# Patient Record
Sex: Female | Born: 1990 | Race: Black or African American | Hispanic: No | Marital: Single | Smoking: Never smoker
Health system: Southern US, Community
[De-identification: ages and names within clinical notes are randomized; demographics above are authoritative.]

## PROBLEM LIST (undated history)

## (undated) DIAGNOSIS — M1990 Unspecified osteoarthritis, unspecified site: Secondary | ICD-10-CM

## (undated) DIAGNOSIS — J45909 Unspecified asthma, uncomplicated: Secondary | ICD-10-CM

---
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
## 2010-05-09 ENCOUNTER — Emergency Department: Payer: Self-pay | Admitting: Emergency Medicine

## 2010-05-31 ENCOUNTER — Emergency Department: Payer: Self-pay

## 2010-07-30 ENCOUNTER — Ambulatory Visit: Payer: Self-pay | Admitting: Family Medicine

## 2010-10-04 ENCOUNTER — Observation Stay: Payer: Self-pay | Admitting: Obstetrics and Gynecology

## 2010-11-23 ENCOUNTER — Ambulatory Visit: Payer: Self-pay | Admitting: Obstetrics and Gynecology

## 2010-11-25 ENCOUNTER — Observation Stay: Payer: Self-pay

## 2010-11-26 ENCOUNTER — Inpatient Hospital Stay: Payer: Self-pay | Admitting: Obstetrics and Gynecology

## 2011-04-01 ENCOUNTER — Emergency Department: Payer: Self-pay | Admitting: Unknown Physician Specialty

## 2011-10-02 ENCOUNTER — Emergency Department: Payer: Self-pay | Admitting: Emergency Medicine

## 2011-11-10 ENCOUNTER — Emergency Department: Payer: Self-pay | Admitting: Emergency Medicine

## 2012-04-26 ENCOUNTER — Emergency Department: Payer: Self-pay | Admitting: Emergency Medicine

## 2013-04-16 ENCOUNTER — Emergency Department: Payer: Self-pay | Admitting: Emergency Medicine

## 2014-02-24 ENCOUNTER — Ambulatory Visit: Payer: Self-pay | Admitting: Emergency Medicine

## 2014-02-24 LAB — PREGNANCY, URINE: Pregnancy Test, Urine: NEGATIVE m[IU]/mL

## 2015-06-27 ENCOUNTER — Emergency Department
Admission: EM | Admit: 2015-06-27 | Discharge: 2015-06-27 | Disposition: A | Discharge status: Home / Self Care | Payer: BC Managed Care – PPO | Attending: Emergency Medicine | Admitting: Emergency Medicine

## 2015-06-27 ENCOUNTER — Emergency Department: Payer: BC Managed Care – PPO

## 2015-06-27 ENCOUNTER — Encounter: Payer: Self-pay | Admitting: Emergency Medicine

## 2015-06-27 DIAGNOSIS — S29012A Strain of muscle and tendon of back wall of thorax, initial encounter: Secondary | ICD-10-CM | POA: Insufficient documentation

## 2015-06-27 DIAGNOSIS — Y9389 Activity, other specified: Secondary | ICD-10-CM | POA: Diagnosis not present

## 2015-06-27 DIAGNOSIS — Y998 Other external cause status: Secondary | ICD-10-CM | POA: Diagnosis not present

## 2015-06-27 DIAGNOSIS — S79922A Unspecified injury of left thigh, initial encounter: Secondary | ICD-10-CM | POA: Diagnosis not present

## 2015-06-27 DIAGNOSIS — Y92410 Unspecified street and highway as the place of occurrence of the external cause: Secondary | ICD-10-CM | POA: Insufficient documentation

## 2015-06-27 DIAGNOSIS — S29019A Strain of muscle and tendon of unspecified wall of thorax, initial encounter: Secondary | ICD-10-CM

## 2015-06-27 DIAGNOSIS — S299XXA Unspecified injury of thorax, initial encounter: Secondary | ICD-10-CM | POA: Diagnosis present

## 2015-06-27 HISTORY — DX: Unspecified osteoarthritis, unspecified site: M19.90

## 2015-06-27 MED ORDER — CYCLOBENZAPRINE HCL 10 MG PO TABS: 10.0000 mg | ORAL_TABLET | Freq: Three times a day (TID) | ORAL | Status: DC | PRN

## 2015-06-27 MED ORDER — IBUPROFEN 800 MG PO TABS: 800.0000 mg | ORAL_TABLET | Freq: Three times a day (TID) | ORAL | Status: DC | PRN
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
## 2016-07-22 ENCOUNTER — Emergency Department
Admission: EM | Admit: 2016-07-22 | Discharge: 2016-07-22 | Discharge status: Against Medical Advice | Payer: BC Managed Care – PPO

## 2019-08-23 ENCOUNTER — Ambulatory Visit: Payer: Medicaid Other | Attending: Internal Medicine

## 2019-08-23 DIAGNOSIS — Z20822 Contact with and (suspected) exposure to covid-19: Secondary | ICD-10-CM

## 2019-08-24 ENCOUNTER — Other Ambulatory Visit: Payer: Medicaid Other

## 2019-08-24 LAB — NOVEL CORONAVIRUS, NAA: SARS-CoV-2, NAA: NOT DETECTED

## 2019-08-26 ENCOUNTER — Other Ambulatory Visit: Payer: Medicaid Other

## 2019-08-31 ENCOUNTER — Ambulatory Visit: Payer: Medicaid Other | Attending: Internal Medicine

## 2019-08-31 DIAGNOSIS — Z20822 Contact with and (suspected) exposure to covid-19: Secondary | ICD-10-CM

## 2019-09-01 LAB — NOVEL CORONAVIRUS, NAA: SARS-CoV-2, NAA: NOT DETECTED

## 2019-09-07 ENCOUNTER — Emergency Department
Admission: EM | Admit: 2019-09-07 | Discharge: 2019-09-07 | Disposition: A | Discharge status: Home / Self Care | Payer: Medicaid Other | Attending: Emergency Medicine | Admitting: Emergency Medicine

## 2019-09-07 ENCOUNTER — Emergency Department: Payer: Medicaid Other

## 2019-09-07 ENCOUNTER — Other Ambulatory Visit: Payer: Self-pay

## 2019-09-07 DIAGNOSIS — J45901 Unspecified asthma with (acute) exacerbation: Secondary | ICD-10-CM | POA: Diagnosis not present

## 2019-09-07 DIAGNOSIS — Z20822 Contact with and (suspected) exposure to covid-19: Secondary | ICD-10-CM | POA: Diagnosis not present

## 2019-09-07 DIAGNOSIS — J40 Bronchitis, not specified as acute or chronic: Secondary | ICD-10-CM

## 2019-09-07 DIAGNOSIS — R0600 Dyspnea, unspecified: Secondary | ICD-10-CM

## 2019-09-07 DIAGNOSIS — R0602 Shortness of breath: Secondary | ICD-10-CM | POA: Diagnosis present

## 2019-09-07 HISTORY — DX: Unspecified asthma, uncomplicated: J45.909

## 2019-09-07 LAB — RESPIRATORY PANEL BY RT PCR (FLU A&B, COVID)
Influenza A by PCR: NEGATIVE
Influenza B by PCR: NEGATIVE
SARS Coronavirus 2 by RT PCR: NEGATIVE

## 2019-09-07 MED ORDER — AZITHROMYCIN 500 MG PO TABS
500.0000 mg | ORAL_TABLET | Freq: Once | ORAL | Status: AC
  Administered 2019-09-07: 500 mg via ORAL
  Filled 2019-09-07: qty 1

## 2019-09-07 MED ORDER — PREDNISONE 10 MG (21) PO TBPK: ORAL_TABLET | ORAL | 0 refills | Status: DC

## 2019-09-07 MED ORDER — HYDROCOD POLST-CPM POLST ER 10-8 MG/5ML PO SUER
5.0000 mL | Freq: Once | ORAL | Status: AC
  Administered 2019-09-07: 5 mL via ORAL
  Filled 2019-09-07: qty 5

## 2019-09-07 MED ORDER — AZITHROMYCIN 250 MG PO TABS: ORAL_TABLET | ORAL | 0 refills | Status: DC

## 2019-09-07 MED ORDER — PREDNISONE 20 MG PO TABS
60.0000 mg | ORAL_TABLET | Freq: Once | ORAL | Status: AC
  Administered 2019-09-07: 60 mg via ORAL
  Filled 2019-09-07: qty 3

## 2019-09-07 MED ORDER — HYDROCOD POLST-CPM POLST ER 10-8 MG/5ML PO SUER: 5.0000 mL | Freq: Two times a day (BID) | ORAL | 0 refills | Status: DC

## 2019-09-07 NOTE — ED Triage Notes (Signed)
Pt to the er for sob since last week. Pt states her son had covid on Feb 1st. Pt tested on Feb1st and 9th, negative both times. Pt was symptomatic then. Pt states the only symptoms that remain are loss of taste, the SOB, and loss smell.

## 2019-09-07 NOTE — ED Notes (Signed)
Attempted to get Covid Swab from patient. Explained procedure to pt. Upon swab entering nostril pt grabbed nurse's wrist and refused to finish the swab. Advised pt that results may not be accurate due to poor test sample. Swab had a small amount of sample on it and has been sent to lab.

## 2019-09-07 NOTE — ED Provider Notes (Signed)
[facility] Emergency Department Provider Note       Time seen: ----------------------------------------- 8:08 PM on 09/07/2019 -----------------------------------------   I have reviewed the triage vital signs and the nursing notes.  HISTORY   Chief Complaint Shortness of Breath    HPI Kylie Jackson is a 29 y.o. female with a history of asthma who presents to the ED for shortness of breath since last week.  Patient states her son had Covid on February 1.  She was tested on the first in the night and was negative both times.  She was symptomatic when she was tested.  She states the only symptoms that remain her loss of taste, the shortness of breath and loss of smell.  Discomfort is 5 out of 10.  Past Medical History:  Diagnosis Date  . Asthma     There are no problems to display for this patient.   Past Surgical History:  Procedure Laterality Date  . CESAREAN SECTION      Allergies Patient has no known allergies.  Social History Social History   Tobacco Use  . Smoking status: Never Smoker  Substance Use Topics  . Alcohol use: Yes    Comment: social  . Drug use: No    Review of Systems Constitutional: Negative for fever. HEENT: Positive for loss of taste and smell Cardiovascular: Negative for chest pain. Respiratory: Positive for shortness of breath Skin: Negative for rash. Neurological: Negative for headaches, focal weakness or numbness.  All systems negative/normal/unremarkable except as stated in the HPI  ____________________________________________   PHYSICAL EXAM:  VITAL SIGNS: ED Triage Vitals  Enc Vitals Group     BP 09/07/19 1923 (!) 143/82     Pulse Rate 09/07/19 1923 98     Resp 09/07/19 1923 (!) 24     Temp 09/07/19 1923 98.9 F (37.2 C)     Temp Source 09/07/19 1923 Oral     SpO2 09/07/19 1923 100 %     Weight 09/07/19 1926 250 lb (113.4 kg)     Height 09/07/19 1926 5\' 3"  (1.6 m)     Head Circumference --       Peak Flow --      Pain Score 09/07/19 1926 5     Pain Loc --      Pain Edu? --      Excl. in [place]? --    Constitutional: Alert and oriented. Well appearing and in no distress. Eyes: Conjunctivae are normal. Normal extraocular movements. Cardiovascular: Normal rate, regular rhythm. No murmurs, rubs, or gallops. Respiratory: Normal respiratory effort without tachypnea nor retractions. Breath sounds are clear and equal bilaterally. No wheezes/rales/rhonchi. Gastrointestinal: Soft and nontender. Normal bowel sounds Musculoskeletal: Nontender with normal range of motion in extremities. No lower extremity tenderness nor edema. Neurologic:  Normal speech and language. No gross focal neurologic deficits are appreciated.  Skin:  Skin is warm, dry and intact. No rash noted. Psychiatric: Mood and affect are normal. Speech and behavior are normal.  ____________________________________________  ED COURSE:  As part of my medical decision making, I reviewed the following data within the [place] History obtained from family if available, nursing notes, old chart and ekg, as well as notes from prior ED visits. Patient presented for dyspnea, we will assess with imaging as indicated at this time.   Procedures  Kylie Jackson was evaluated in Emergency Department on 09/07/2019 for the symptoms described in the history of present illness. She was evaluated in  the context of the global COVID-19 pandemic, which necessitated consideration that the patient might be at risk for infection with the SARS-CoV-2 virus that causes COVID-19. Institutional protocols and algorithms that pertain to the evaluation of patients at risk for COVID-19 are in a state of rapid change based on information released by regulatory bodies including the CDC and federal and state organizations. These policies and algorithms were followed during the patient's care in the ED.   ____________________________________________   RADIOLOGY Images were viewed by me  Chest x-ray IMPRESSION:  No active disease.  ____________________________________________   DIFFERENTIAL DIAGNOSIS   COVID-19, pneumonia, bronchitis  FINAL ASSESSMENT AND PLAN  Bronchitis   Plan: The patient had presented for persistent cough and Covid-like symptoms with negative testing. Patient's labs are still pending at this time. Patient's imaging was reassuring.  Patient was given cough medicine, steroids and antibiotics.  She is cleared for outpatient follow-up.   Ulice Dash, MD    Note: This note was generated in part or whole with voice recognition software. Voice recognition is usually quite accurate but there are transcription errors that can and very often do occur. I apologize for any typographical errors that were not detected and corrected.     Emily Filbert, MD 09/07/19 [phone]

## 2019-12-28 ENCOUNTER — Other Ambulatory Visit: Payer: Self-pay

## 2019-12-28 ENCOUNTER — Emergency Department
Admission: EM | Admit: 2019-12-28 | Discharge: 2019-12-28 | Disposition: A | Discharge status: Home / Self Care | Payer: Medicaid Other | Attending: Emergency Medicine | Admitting: Emergency Medicine

## 2019-12-28 DIAGNOSIS — T7840XA Allergy, unspecified, initial encounter: Secondary | ICD-10-CM | POA: Diagnosis present

## 2019-12-28 DIAGNOSIS — T385X5A Adverse effect of other estrogens and progestogens, initial encounter: Secondary | ICD-10-CM | POA: Diagnosis not present

## 2019-12-28 DIAGNOSIS — T887XXA Unspecified adverse effect of drug or medicament, initial encounter: Secondary | ICD-10-CM | POA: Insufficient documentation

## 2019-12-28 DIAGNOSIS — T50905A Adverse effect of unspecified drugs, medicaments and biological substances, initial encounter: Secondary | ICD-10-CM

## 2019-12-28 NOTE — ED Triage Notes (Signed)
Pt to ED with possible allergic rxn to depo shot this morning. States her face started feeling hot and flushed with SHOB. Denies CP or SHOB at this time, reports still feels flushed.  RR even and unlabored, in NAD at this time.  States has been on depo shot for years, recently had COVID vaccine

## 2019-12-28 NOTE — ED Triage Notes (Addendum)
First nurse note- [employer] [place], had depo shot at 1020.  Face feels flushed. No hives, rash, or respiratory distress. VSS with EMS
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
## 2019-12-28 NOTE — Discharge Instructions (Addendum)
Follow-up with your primary care provider if any continued problems.  Return to the emergency department if any worsening of your symptoms.  You may also take Benadryl as needed as long as you are not driving.

## 2019-12-28 NOTE — ED Notes (Signed)
See triage note, pt reports possible allergic rxn to depo shot this morning. No hives noted, RR even and unlabored. Pt in NAD.

## 2020-07-30 IMAGING — DX DG CHEST 1V
1 series · 1 of 1 positions shown · non-contrast
Comparison: None.

CLINICAL DATA: Shortness of breath

EXAM:
CHEST  1 VIEW

[chest ap]
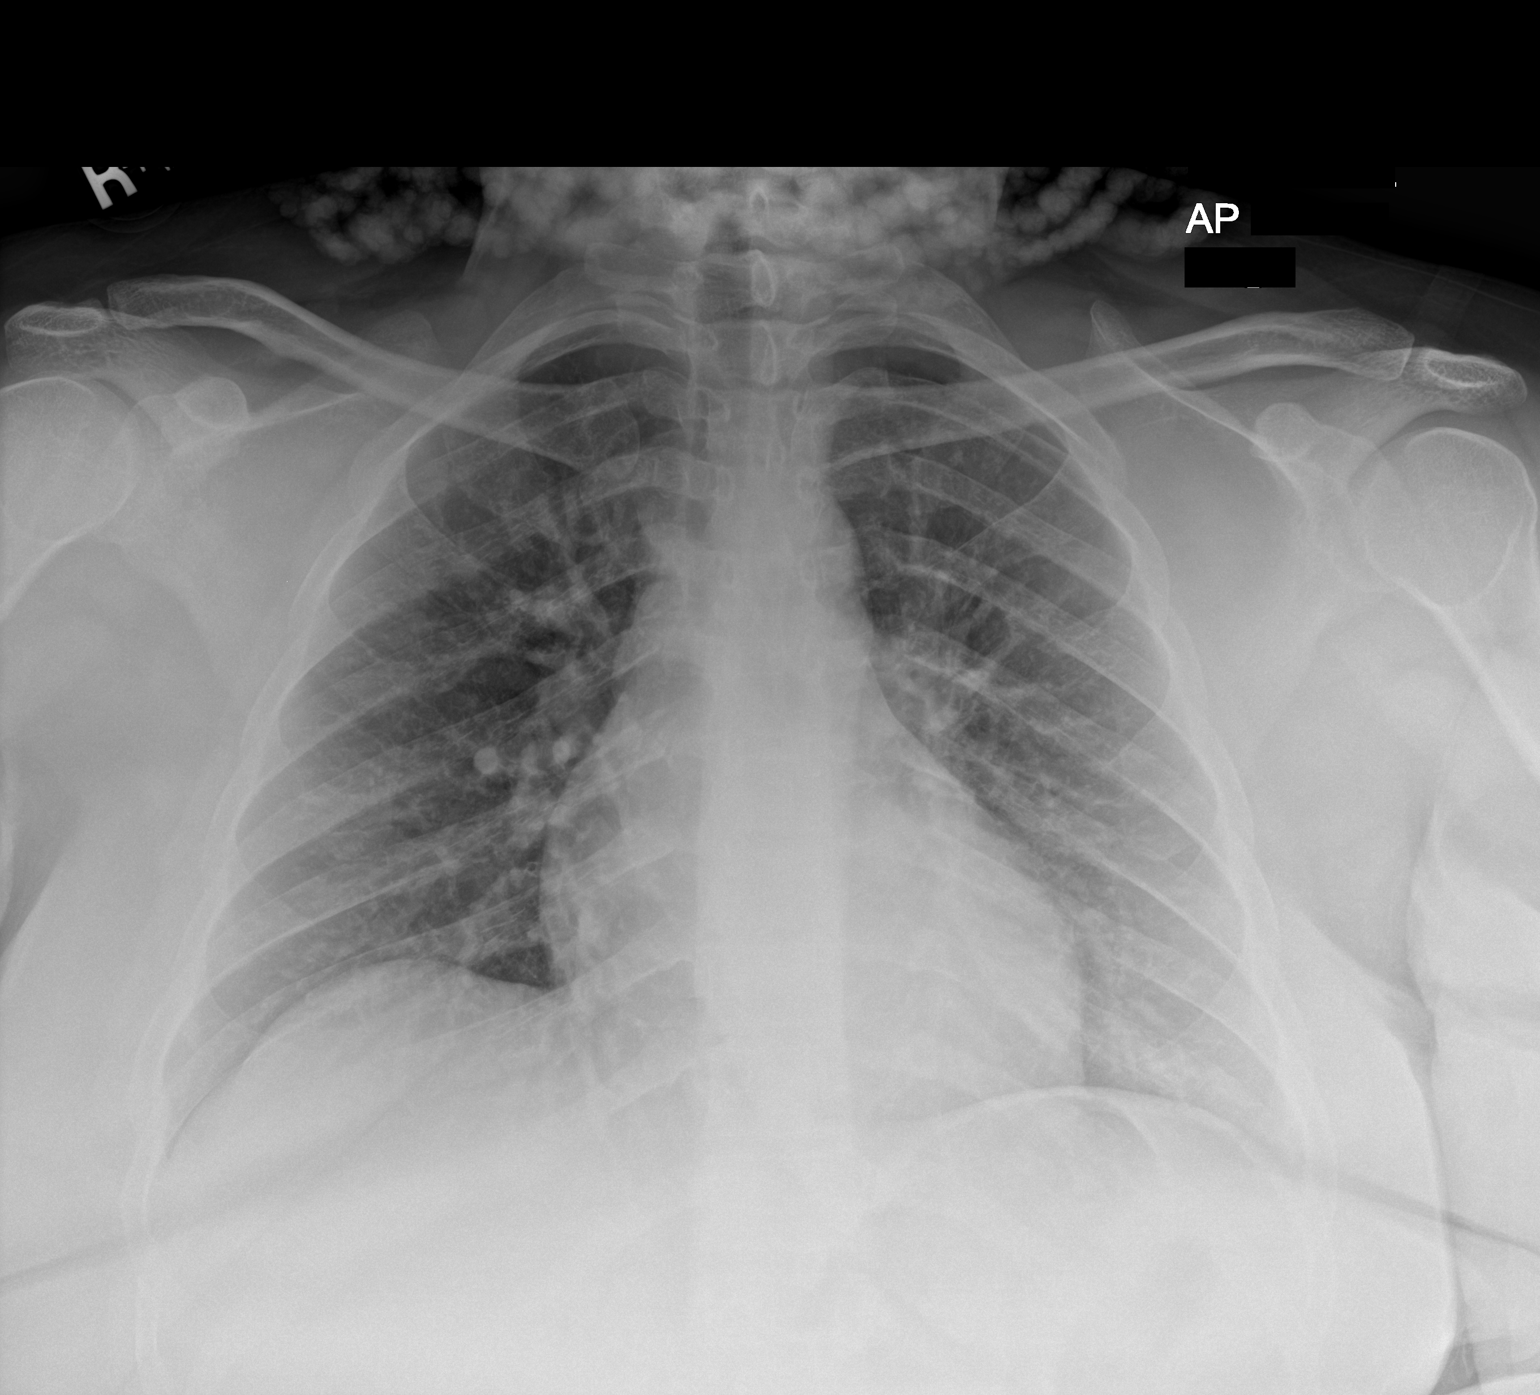

[1 of 1 positions shown; findings below may reference images not displayed]

FINDINGS: The heart size and mediastinal contours are within normal limits.
Both lungs are clear. The visualized skeletal structures are
unremarkable.
IMPRESSION: No active disease.

## 2020-12-25 ENCOUNTER — Ambulatory Visit: Payer: Medicaid Other | Admitting: Dietician

## 2021-01-15 ENCOUNTER — Encounter: Payer: Self-pay | Admitting: Dietician

## 2021-01-15 NOTE — Progress Notes (Signed)
Have not heard back from patient to reschedule her missed appointment from 12/25/20. Sent notification to referring provider.

## 2024-01-30 ENCOUNTER — Ambulatory Visit
Admission: EM | Admit: 2024-01-30 | Discharge: 2024-01-30 | Disposition: A | Discharge status: Home / Self Care | Attending: Emergency Medicine | Admitting: Emergency Medicine

## 2024-01-30 DIAGNOSIS — W503XXA Accidental bite by another person, initial encounter: Secondary | ICD-10-CM | POA: Diagnosis not present

## 2024-01-30 DIAGNOSIS — S0185XA Open bite of other part of head, initial encounter: Secondary | ICD-10-CM | POA: Diagnosis not present

## 2024-01-30 DIAGNOSIS — R220 Localized swelling, mass and lump, head: Secondary | ICD-10-CM

## 2024-01-30 DIAGNOSIS — T148XXA Other injury of unspecified body region, initial encounter: Secondary | ICD-10-CM

## 2024-01-30 MED ORDER — FLUCONAZOLE 150 MG PO TABS: 150.0000 mg | ORAL_TABLET | Freq: Once | ORAL | 0 refills | Status: AC

## 2024-01-30 MED ORDER — AMOXICILLIN-POT CLAVULANATE 875-125 MG PO TABS: 1.0000 | ORAL_TABLET | Freq: Two times a day (BID) | ORAL | 0 refills | Status: AC
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
## 2024-01-30 NOTE — Discharge Instructions (Addendum)
 Take the Augmentin  and Diflucan  as directed. Follow up with your primary care provider.
